# Patient Record
Sex: Male | Born: 2000 | Race: White | Hispanic: No | Marital: Single | Smoking: Never smoker
Health system: Southern US, Community
[De-identification: ages and names within clinical notes are randomized; demographics above are authoritative.]

## PROBLEM LIST (undated history)

## (undated) DIAGNOSIS — G8929 Other chronic pain: Secondary | ICD-10-CM

## (undated) HISTORY — PX: HAND SURGERY: SHX662

---

## 2020-07-02 ENCOUNTER — Other Ambulatory Visit: Payer: Self-pay

## 2020-07-02 ENCOUNTER — Emergency Department
Admission: EM | Admit: 2020-07-02 | Discharge: 2020-07-02 | Disposition: A | Discharge status: Home / Self Care | Payer: BC Managed Care – PPO | Attending: Student in an Organized Health Care Education/Training Program | Admitting: Student in an Organized Health Care Education/Training Program

## 2020-07-02 ENCOUNTER — Encounter: Payer: Self-pay | Admitting: Emergency Medicine

## 2020-07-02 ENCOUNTER — Emergency Department: Payer: BC Managed Care – PPO

## 2020-07-02 DIAGNOSIS — Y9389 Activity, other specified: Secondary | ICD-10-CM | POA: Diagnosis not present

## 2020-07-02 DIAGNOSIS — S62326A Displaced fracture of shaft of fifth metacarpal bone, right hand, initial encounter for closed fracture: Secondary | ICD-10-CM | POA: Diagnosis not present

## 2020-07-02 DIAGNOSIS — Y9289 Other specified places as the place of occurrence of the external cause: Secondary | ICD-10-CM | POA: Diagnosis not present

## 2020-07-02 DIAGNOSIS — Y999 Unspecified external cause status: Secondary | ICD-10-CM | POA: Diagnosis not present

## 2020-07-02 DIAGNOSIS — X58XXXA Exposure to other specified factors, initial encounter: Secondary | ICD-10-CM | POA: Diagnosis not present

## 2020-07-02 DIAGNOSIS — S6990XA Unspecified injury of unspecified wrist, hand and finger(s), initial encounter: Secondary | ICD-10-CM | POA: Diagnosis present

## 2020-07-02 NOTE — Discharge Instructions (Addendum)
Follow up with Dr Martha Clan, call on Monday for a follow up appointment, wear the splint until evaluated by orthopedics, cover it while in the shower, take tylenol and ibuprofen for pain as needed

## 2020-07-02 NOTE — ED Provider Notes (Signed)
[facility] Emergency Department Provider Note  ____________________________________________   First MD Initiated Contact with Patient 07/02/20 1155     (approximate)  I have reviewed the triage vital signs and the nursing notes.   HISTORY  Chief Complaint Hand Injury    HPI Johnathan Gutierrez is a 19 y.o. male presents emergency department right hand pain.  Patient states he punched a wooden tenths last night.  Continued pain and swelling to the right hand.  No other injuries reported.  Pain scale is 5/10    History reviewed. No pertinent past medical history.  There are no problems to display for this patient.   History reviewed. No pertinent surgical history.  Prior to Admission medications   Medication Sig Start Date End Date Taking? Authorizing Provider  desvenlafaxine (PRISTIQ) 50 MG 24 hr tablet Take 50 mg by mouth daily.   Yes [provider]  lamoTRIgine (LAMICTAL) 25 MG tablet Take 50 mg by mouth daily.   Yes [provider]  traZODone (DESYREL) 50 MG tablet Take 50 mg by mouth at bedtime.   Yes [provider]    Allergies Peanut-containing drug products  No family history on file.  Social History Social History   Tobacco Use  . Smoking status: Never Smoker  . Smokeless tobacco: Never Used  Substance Use Topics  . Alcohol use: Not on file  . Drug use: Not on file    Review of Systems  Constitutional: No fever/chills Eyes: No visual changes. ENT: No sore throat. Respiratory: Denies cough Genitourinary: Negative for dysuria. Musculoskeletal: Negative for back pain.  Positive for right hand pain Skin: Negative for rash. Psychiatric: no mood changes,     ____________________________________________   PHYSICAL EXAM:  VITAL SIGNS: ED Triage Vitals  Enc Vitals Group     BP 07/02/20 1147 120/75     Pulse Rate 07/02/20 1147 70     Resp 07/02/20 1147 18     Temp 07/02/20 1147 98 F (36.7 C)      Temp Source 07/02/20 1147 Oral     SpO2 07/02/20 1147 100 %     Weight 07/02/20 1143 195 lb (88.5 kg)     Height 07/02/20 1143 6\' 3"  (1.905 m)     Head Circumference --      Peak Flow --      Pain Score 07/02/20 1142 8     Pain Loc --      Pain Edu? --      Excl. in GC? --     Constitutional: Alert and oriented. Well appearing and in no acute distress. Eyes: Conjunctivae are normal.  Head: Atraumatic. Nose: No congestion/rhinnorhea. Mouth/Throat: Mucous membranes are moist.   Neck:  supple no lymphadenopathy noted Cardiovascular: Normal rate, regular rhythm.  Respiratory: Normal respiratory effort.  No retractions,  GU: deferred Musculoskeletal: FROM all extremities, warm and well perfused, swelling noted to the metacarpal area of the right hand along fourth and fifth metacarpals, neurovascular is intact, tenderness noted along fourth and fifth metacarpals Neurologic:  Normal speech and language.  Skin:  Skin is warm, dry and intact. No rash noted. Psychiatric: Mood and affect are normal. Speech and behavior are normal.  ____________________________________________   LABS (all labs ordered are listed, but only abnormal results are displayed)  Labs Reviewed - No data to display ____________________________________________   ____________________________________________  RADIOLOGY  X-ray of the right hand shows a fracture of the fifth metacarpal  ____________________________________________   PROCEDURES  Procedure(s)  performed: Ulnar gutter OCL applied by the tech   Procedures    ____________________________________________   INITIAL IMPRESSION / ASSESSMENT AND PLAN / ED COURSE  Pertinent labs & imaging results that were available during my care of the patient were reviewed by me and considered in my medical decision making (see chart for details).   Patient is a 19 year old male presents emergency department for right hand injury.  See HPI.  Physical exam is  consistent with a metacarpal fracture.  X-ray of the right hand shows fifth metacarpal fracture along with shaft  Did explain findings to the patient.  Is placed in a ulnar gutter OCL.  He was instructed to follow-up with [facility].  Elevate and ice.  Tylenol and ibuprofen for pain.  Patient states he does not need anything stronger for pain.  He was discharged in stable condition.     Johnathan Gutierrez was evaluated in Emergency Department on 07/02/2020 for the symptoms described in the history of present illness. He was evaluated in the context of the global COVID-19 pandemic, which necessitated consideration that the patient might be at risk for infection with the SARS-CoV-2 virus that causes COVID-19. Institutional protocols and algorithms that pertain to the evaluation of patients at risk for COVID-19 are in a state of rapid change based on information released by regulatory bodies including the CDC and federal and state organizations. These policies and algorithms were followed during the patient's care in the ED.    As part of my medical decision making, I reviewed the following data within the electronic MEDICAL RECORD NUMBER Nursing notes reviewed and incorporated, Old chart reviewed, Radiograph reviewed , Notes from prior ED visits and [place] Controlled Substance Database  ____________________________________________   FINAL CLINICAL IMPRESSION(S) / ED DIAGNOSES  Final diagnoses:  Displaced fracture of shaft of fifth metacarpal bone, right hand, initial encounter for closed fracture      NEW MEDICATIONS STARTED DURING THIS VISIT:  New Prescriptions   No medications on file     Note:  This document was prepared using Dragon voice recognition software and may include unintentional dictation errors.    Faythe Ghee, PA-C 07/02/20 1258    Willy Eddy, MD 07/02/20 1444

## 2020-07-02 NOTE — ED Triage Notes (Signed)
Presents with right hand pain  States he punched a wooden fencer last pm  Swelling noted to lateral aspect of hand

## 2020-11-22 ENCOUNTER — Other Ambulatory Visit: Payer: Self-pay | Admitting: Rheumatology

## 2020-11-22 DIAGNOSIS — M25559 Pain in unspecified hip: Secondary | ICD-10-CM

## 2020-11-22 DIAGNOSIS — M461 Sacroiliitis, not elsewhere classified: Secondary | ICD-10-CM

## 2020-11-24 ENCOUNTER — Emergency Department
Admission: EM | Admit: 2020-11-24 | Discharge: 2020-11-24 | Disposition: A | Discharge status: Home / Self Care | Payer: BC Managed Care – PPO | Attending: Emergency Medicine | Admitting: Emergency Medicine

## 2020-11-24 ENCOUNTER — Other Ambulatory Visit: Payer: Self-pay

## 2020-11-24 ENCOUNTER — Encounter: Payer: Self-pay | Admitting: Emergency Medicine

## 2020-11-24 DIAGNOSIS — R1030 Lower abdominal pain, unspecified: Secondary | ICD-10-CM | POA: Insufficient documentation

## 2020-11-24 DIAGNOSIS — R42 Dizziness and giddiness: Secondary | ICD-10-CM | POA: Diagnosis not present

## 2020-11-24 DIAGNOSIS — R110 Nausea: Secondary | ICD-10-CM | POA: Diagnosis not present

## 2020-11-24 DIAGNOSIS — Z5321 Procedure and treatment not carried out due to patient leaving prior to being seen by health care provider: Secondary | ICD-10-CM | POA: Insufficient documentation

## 2020-11-24 HISTORY — DX: Other chronic pain: G89.29

## 2020-11-24 LAB — URINALYSIS, COMPLETE (UACMP) WITH MICROSCOPIC
Bilirubin Urine: NEGATIVE
Glucose, UA: NEGATIVE mg/dL
Hgb urine dipstick: NEGATIVE
Ketones, ur: NEGATIVE mg/dL
Leukocytes,Ua: NEGATIVE
Nitrite: NEGATIVE
Protein, ur: NEGATIVE mg/dL
Specific Gravity, Urine: 1.019 (ref 1.005–1.030)
Squamous Epithelial / HPF: NONE SEEN (ref 0–5)
pH: 6 (ref 5.0–8.0)

## 2020-11-24 LAB — CBC
HCT: 48.7 % (ref 39.0–52.0)
Hemoglobin: 16.4 g/dL (ref 13.0–17.0)
MCH: 28.3 pg (ref 26.0–34.0)
MCHC: 33.7 g/dL (ref 30.0–36.0)
MCV: 84.1 fL (ref 80.0–100.0)
Platelets: 230 10*3/uL (ref 150–400)
RBC: 5.79 MIL/uL (ref 4.22–5.81)
RDW: 12 % (ref 11.5–15.5)
WBC: 5 10*3/uL (ref 4.0–10.5)
nRBC: 0 % (ref 0.0–0.2)

## 2020-11-24 LAB — COMPREHENSIVE METABOLIC PANEL
ALT: 25 U/L (ref 0–44)
AST: 21 U/L (ref 15–41)
Albumin: 4.9 g/dL (ref 3.5–5.0)
Alkaline Phosphatase: 86 U/L (ref 38–126)
Anion gap: 10 (ref 5–15)
BUN: 17 mg/dL (ref 6–20)
CO2: 30 mmol/L (ref 22–32)
Calcium: 10.2 mg/dL (ref 8.9–10.3)
Chloride: 103 mmol/L (ref 98–111)
Creatinine, Ser: 0.89 mg/dL (ref 0.61–1.24)
GFR, Estimated: >60 mL/min (ref >60)
Glucose, Bld: 110 mg/dL — ABNORMAL HIGH (ref 70–99)
Potassium: 5 mmol/L (ref 3.5–5.1)
Sodium: 143 mmol/L (ref 135–145)
Total Bilirubin: 1 mg/dL (ref 0.3–1.2)
Total Protein: 8 g/dL (ref 6.5–8.1)

## 2020-11-24 LAB — LIPASE, BLOOD: Lipase: 31 U/L (ref 11–51)

## 2020-11-24 NOTE — ED Triage Notes (Signed)
Pt comes into the ED via POV c/o lower abdominal pain that wraps around to the right lower back.  Pt denies any h/o kidney stones.  Pt denies any vomiting but has had some nausea.  Pt also admits to some dizziness and feels like his breath is being taken away because the pain is so bad.  Pt states he ws recently diagnosed with a pain disorder, but denies being on any narcotics.  Pt states last BM was yesterday evening.  Pt currently in NAD with even and unlabored respirations.  Denies any urination problems.

## 2020-12-02 ENCOUNTER — Other Ambulatory Visit: Payer: Self-pay

## 2020-12-02 ENCOUNTER — Ambulatory Visit
Admission: RE | Admit: 2020-12-02 | Discharge: 2020-12-02 | Disposition: A | Discharge status: Home / Self Care | Payer: BC Managed Care – PPO | Source: Ambulatory Visit | Attending: Rheumatology | Admitting: Rheumatology

## 2020-12-02 DIAGNOSIS — M461 Sacroiliitis, not elsewhere classified: Secondary | ICD-10-CM

## 2020-12-02 DIAGNOSIS — M25559 Pain in unspecified hip: Secondary | ICD-10-CM

## 2021-02-25 IMAGING — DX DG HAND COMPLETE 3+V*R*
3 series · 3 of 3 positions shown · non-contrast
Comparison: None.

CLINICAL DATA: Right hand pain to ulnar aspect of hand as well as
swelling. Patient states pain all over but mainly in fifth
metacarpal. Patient states that he punched [place] fence last
night.

EXAM:
RIGHT HAND - COMPLETE 3+ VIEW

[hand ap]
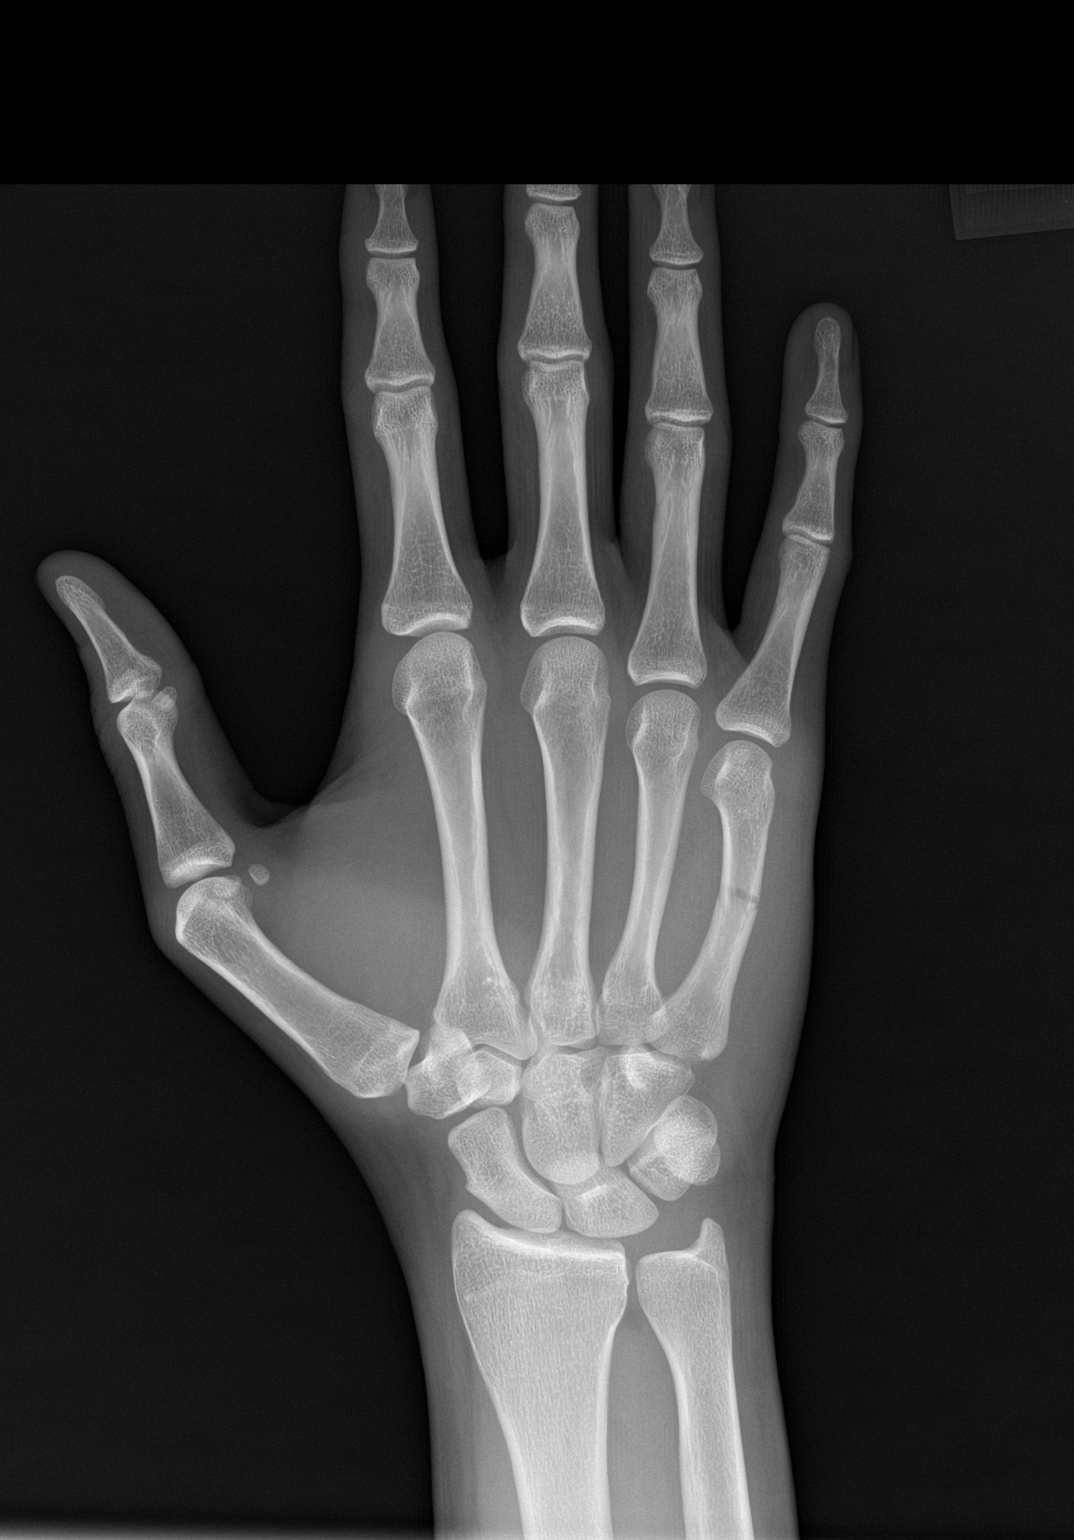

[hand obl]
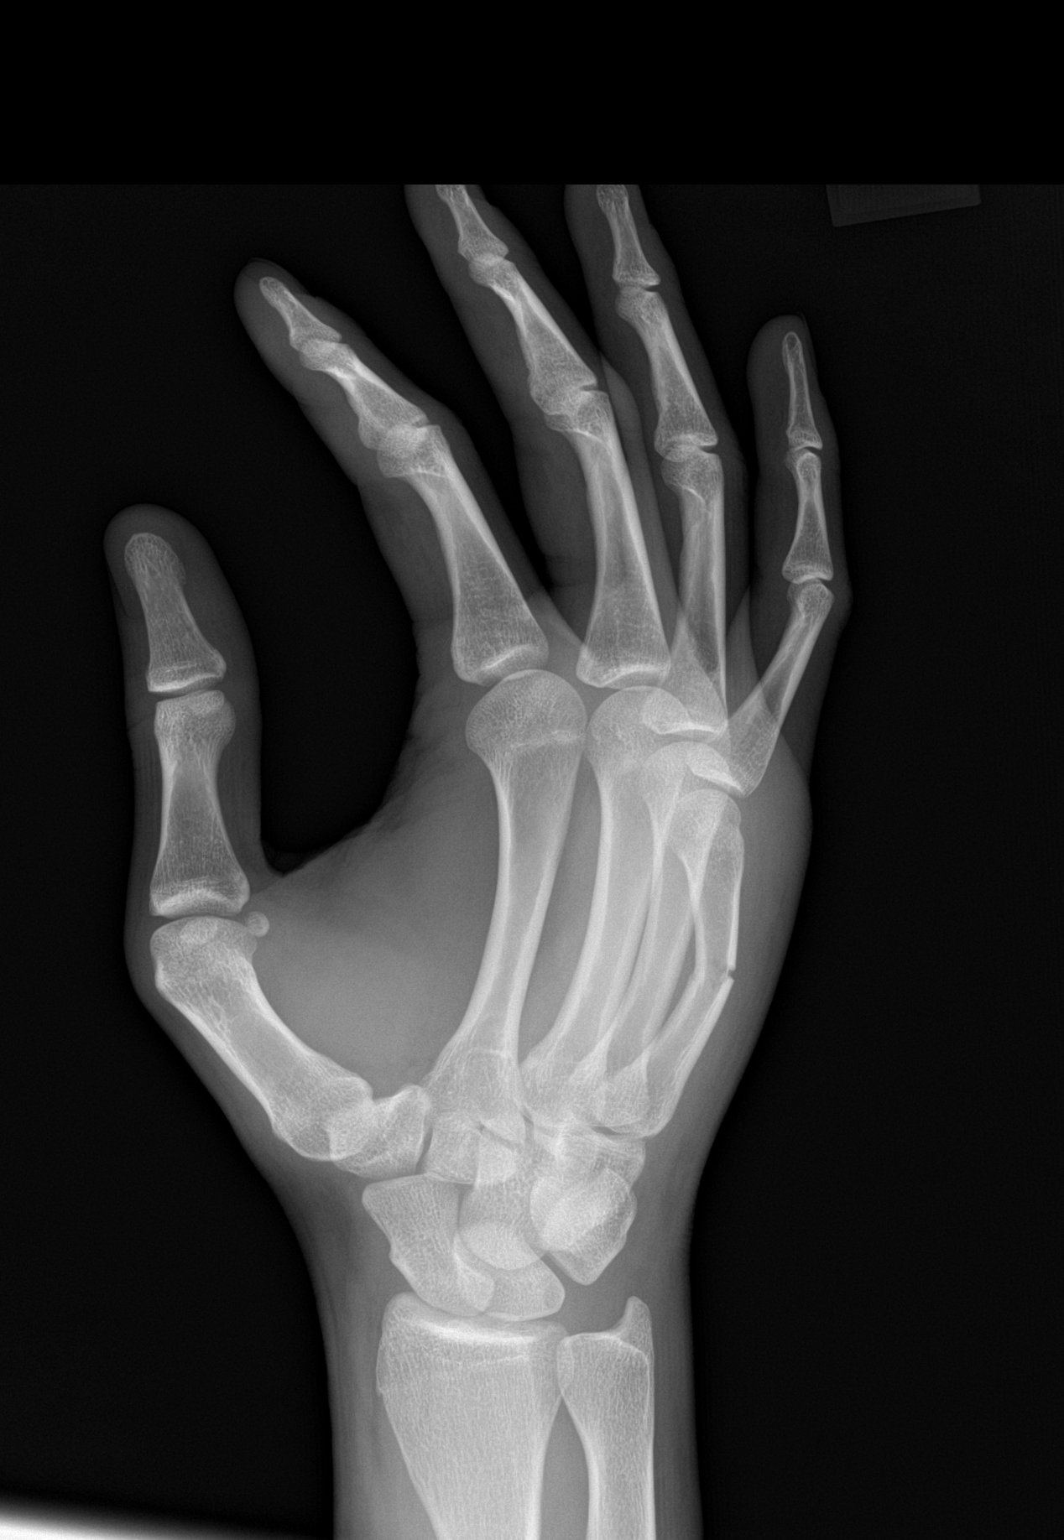

[hand lat]
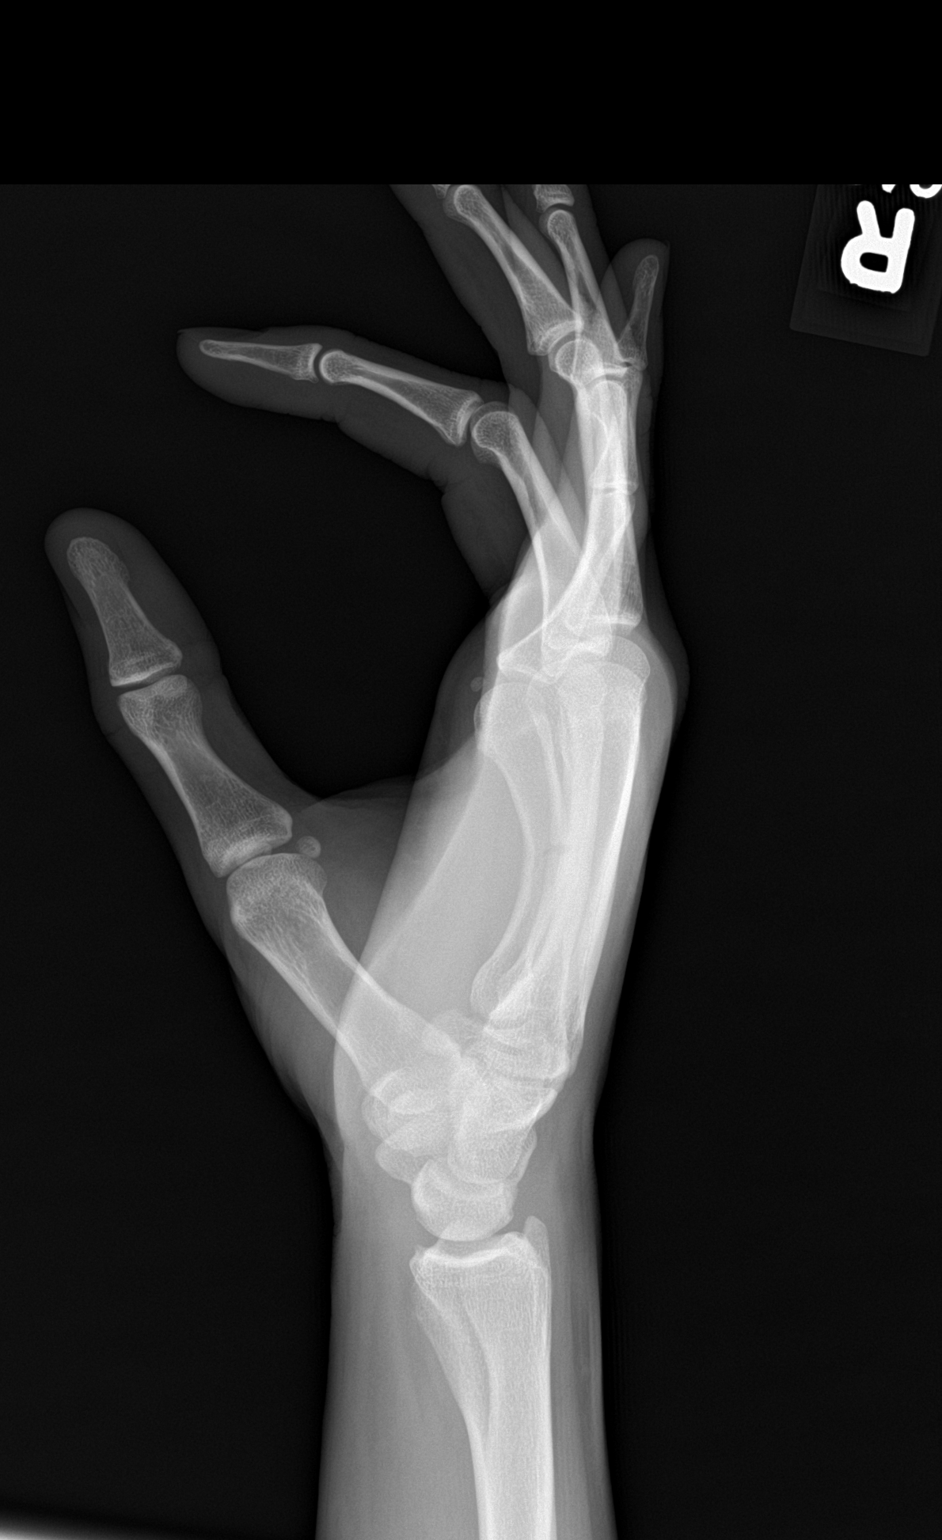

[3 of 3 positions shown; findings below may reference images not displayed]

FINDINGS: There is a mildly displaced and angulated transverse fracture of the
fifth metacarpal mid shaft. The remaining bones of the right hand
appear normal. Regional soft tissues are unremarkable.
IMPRESSION: Mildly displaced and angulated fracture of the fifth metacarpal mid
shaft.

## 2022-06-11 ENCOUNTER — Encounter: Payer: Self-pay | Admitting: Family Medicine

## 2022-06-11 ENCOUNTER — Other Ambulatory Visit: Payer: Self-pay | Admitting: Family Medicine

## 2022-06-11 DIAGNOSIS — G894 Chronic pain syndrome: Secondary | ICD-10-CM

## 2022-06-11 DIAGNOSIS — R5382 Chronic fatigue, unspecified: Secondary | ICD-10-CM | POA: Insufficient documentation

## 2022-06-11 HISTORY — DX: Chronic pain syndrome: G89.4

## 2022-07-05 ENCOUNTER — Ambulatory Visit (INDEPENDENT_AMBULATORY_CARE_PROVIDER_SITE_OTHER): Payer: BC Managed Care – PPO | Admitting: Oncology

## 2022-07-05 ENCOUNTER — Encounter: Payer: Self-pay | Admitting: Oncology

## 2022-07-05 VITALS — BP 122/60 | HR 103 | Temp 97.9°F | Resp 18 | Ht 76.25 in | Wt 221.0 lb

## 2022-07-05 DIAGNOSIS — R1030 Lower abdominal pain, unspecified: Secondary | ICD-10-CM | POA: Diagnosis not present

## 2022-07-05 DIAGNOSIS — R197 Diarrhea, unspecified: Secondary | ICD-10-CM | POA: Diagnosis not present

## 2022-07-05 DIAGNOSIS — R110 Nausea: Secondary | ICD-10-CM

## 2022-07-05 NOTE — Progress Notes (Signed)
[facility] [address] [place], [place] [address] Phone: [phone] Fax: [phone]   Office Visit Note  Patient Name: Johnathan Gutierrez  Date of [id number]  Med Rec number [id number]  Date of Service: 07/05/2022  Peanut-containing drug products  Chief Complaint  Patient presents with   Cough   C/o chills, body aches, fatigue, nausea, feeling hot and cold and skin sensitivity X 2 days. No sick contacts. No sore throat. Some lower abdominal pain. Had diarrhea yesterday X 4. Had a quesadilla for lunch yesterday. Took Tylenol yesterday twice at 10am and 4pm. Ate a piece of toast with butter this morning and tolerated well.  Nausea has improved today.  Current Medication:  Outpatient Encounter Medications as of 07/05/2022  Medication Sig   DULoxetine (CYMBALTA) 30 MG capsule Take 90 mg by mouth daily.   lamoTRIgine (LAMICTAL) 25 MG tablet Take 50 mg by mouth daily.   traZODone (DESYREL) 50 MG tablet Take 50 mg by mouth at bedtime.   desvenlafaxine (PRISTIQ) 50 MG 24 hr tablet Take 50 mg by mouth daily. (Patient not taking: Reported on 07/05/2022)   No facility-administered encounter medications on file as of 07/05/2022.    Medical History: Past Medical History:  Diagnosis Date   Chronic pain    Pain syndrome, chronic 06/11/2022     Vital Signs: Pulse (!) 103   Temp 97.9 F (36.6 C) (Tympanic)   Resp 18   Ht 6' 4.25" (1.937 m)   Wt 221 lb (100.2 kg)   SpO2 99%   BMI 26.72 kg/m    Review of Systems  Constitutional:  Positive for chills, diaphoresis and fatigue.  Gastrointestinal:  Positive for abdominal pain, diarrhea and nausea.  Musculoskeletal:  Positive for myalgias.  Neurological:  Positive for weakness.    Physical Exam Constitutional:      Appearance: Normal appearance.  HENT:     Right Ear: Tympanic membrane normal.     Left Ear: Tympanic membrane normal.     Nose: Nose normal.     Mouth/Throat:     Mouth: Mucous membranes are moist.      Pharynx: Posterior oropharyngeal erythema present.  Cardiovascular:     Rate and Rhythm: Normal rate and regular rhythm.  Abdominal:     General: Bowel sounds are increased.     Palpations: Abdomen is soft.     Tenderness: There is no abdominal tenderness.     Comments: No reproducible abdominal pain  Neurological:     Mental Status: He is alert.    No results found for this or any previous visit (from the past 24 hour(s)).  Assessment/Plan: 1. Diarrhea, unspecified type -Symptoms consistent with viral gastroenteritis.  Afebrile.  Exam reassuring. -COVID and flu negative. -Diarrhea appears to have improved some today.  Had 4 episodes yesterday. -Continue to hydrate and eat bland diet.  Education provided in clinic and also via MyChart. -Discussed viral illness typically last about 5 days.  Symptoms should start to improve over the next day or so.  Please let me know if they do not.  2. Nausea -Nausea has improved today as well.  No vomiting. -Bland diet per above. -Please let me know if this worsens and we can send in and antiemetic.  3. Lower abdominal pain -Exam benign.  Nonreproducible lower abdominal pain. -Continue to monitor for now. -Symptoms appear to be improving from yesterday. -May take Tylenol for abdominal pain and/or fever.  Do not recommend ibuprofen due to possibly worsening belly issues.  Disposition-return to clinic if symptoms worsen or fail to improve.   General Counseling: tymere depuy understanding of the findings of todays visit and agrees with plan of treatment. I have discussed any further diagnostic evaluation that may be needed or ordered today. We also reviewed his medications today. he has been encouraged to call the office with any questions or concerns that should arise related to todays visit.   No orders of the defined types were placed in this encounter.   No orders of the defined types were placed in this encounter.   I spent 20  minutes dedicated to the care of this patient (face-to-face and non-face-to-face) on the date of the encounter to include what is described in the assessment and plan.   Durenda Hurt, NP 07/05/2022 1:05 PM

## 2023-02-06 ENCOUNTER — Ambulatory Visit (INDEPENDENT_AMBULATORY_CARE_PROVIDER_SITE_OTHER): Payer: No Typology Code available for payment source | Admitting: Oncology

## 2023-02-06 ENCOUNTER — Encounter: Payer: Self-pay | Admitting: Oncology

## 2023-02-06 VITALS — HR 110 | Temp 97.2°F | Ht 75.0 in | Wt 225.0 lb

## 2023-02-06 DIAGNOSIS — H9202 Otalgia, left ear: Secondary | ICD-10-CM

## 2023-02-06 MED ORDER — NEOMYCIN-POLYMYXIN-HC 3.5-10000-1 OT SOLN: 4.0000 [drp] | Freq: Four times a day (QID) | OTIC | 0 refills | Status: AC

## 2023-02-06 NOTE — Progress Notes (Signed)
[place] [place], [place] [address] Phone: [phone] Fax: [phone]   Office Visit Note  Patient Name: Johnathan Gutierrez  Date of [id number]  Med Rec number [id number]  Date of Service: 02/06/2023  Peanut-containing drug products  No chief complaint on file.  Patient is an 22 y.o. student here for complaints of left ear and jaw pain that developed last Wednesday. Got home Thursday night from [place] and sx felt  worse. Friday Johnathan Gutierrez woke up with decreased hearing.  Went to urgent care on Friday and was given polymixin ear drops and Augmentin. Was told ear was very swollen and was hard to tell if it was an inner ear infection verse outer ear (swimmers ear). Started antibiotics on Saturday. Feels like it is improving some but not quickly. Noticed some drainage to pillowcase this morning when Johnathan Gutierrez woke up.   Has hx of firbromyalgia.   Current Medication:  Outpatient Encounter Medications as of 02/06/2023  Medication Sig   desvenlafaxine (PRISTIQ) 50 MG 24 hr tablet Take 50 mg by mouth daily. (Patient not taking: Reported on 07/05/2022)   DULoxetine (CYMBALTA) 30 MG capsule Take 90 mg by mouth daily.   lamoTRIgine (LAMICTAL) 25 MG tablet Take 50 mg by mouth daily.   traZODone (DESYREL) 50 MG tablet Take 50 mg by mouth at bedtime.   No facility-administered encounter medications on file as of 02/06/2023.   Medical History: Past Medical History:  Diagnosis Date   Chronic pain    Pain syndrome, chronic 06/11/2022   Vital Signs: There were no vitals taken for this visit.  ROS: As per HPI.  All other pertinent ROS negative.     Review of Systems  HENT:  Positive for dental problem (Right jaw pain), ear discharge, ear pain and hearing loss.     Physical Exam Constitutional:      Appearance: Normal appearance.  HENT:     Right Ear: Tympanic membrane normal. There is impacted cerumen.     Left Ear: Decreased hearing noted. Drainage, swelling and tenderness  present.  Neurological:     Mental Status: Johnathan Gutierrez is alert.     No results found for this or any previous visit (from the past 24 hour(s)).  Assessment/Plan: 1. Ear pain, left -Unable to clearly visualize tympanic membrane.  Lots of cerumen and swelling present.  Do not want to irrigate given ear pain.  Have some concern for possible inner ear rupture given drainage Johnathan Gutierrez noticed this morning.  This also can be present with swimmer's ear.  Discussed switching to Cortisporin that has a steroid to help with swelling but also contains the antibiotic.  Johnathan Gutierrez will do 4 drops 4 times a day in left ear.  Continue Augmentin per urgent care provider.  Please let me know if your symptoms worsen or fail to improve.  Discussed ENT referral should this happen.  Would recommend follow-up next week.  - amoxicillin-clavulanate (AUGMENTIN) 875-125 MG tablet; Take 1 tablet by mouth 2 (two) times daily. - neomycin-polymyxin-hydrocortisone (CORTISPORIN) OTIC solution; Place 4 drops into both ears 4 (four) times daily.  Dispense: 10 mL; Refill: 0  Disposition-return to clinic next week for follow-up  General Counseling: ryatt carodine understanding of the findings of todays visit and agrees with plan of treatment. I have discussed any further diagnostic evaluation that may be needed or ordered today. We also reviewed his medications today. Johnathan Gutierrez has been encouraged to call the office with any questions or concerns that should arise related  to todays visit.   No orders of the defined types were placed in this encounter.   No orders of the defined types were placed in this encounter.   I spent 20 minutes dedicated to the care of this patient (face-to-face and non-face-to-face) on the date of the encounter to include what is described in the assessment and plan.   Faythe Casa, NP 02/06/2023 8:42 AM

## 2023-02-11 ENCOUNTER — Ambulatory Visit (INDEPENDENT_AMBULATORY_CARE_PROVIDER_SITE_OTHER): Payer: No Typology Code available for payment source | Admitting: Oncology

## 2023-02-11 ENCOUNTER — Encounter: Payer: Self-pay | Admitting: Oncology

## 2023-02-11 VITALS — BP 130/70 | HR 90 | Temp 98.7°F

## 2023-02-11 DIAGNOSIS — H9202 Otalgia, left ear: Secondary | ICD-10-CM

## 2023-02-11 NOTE — Progress Notes (Signed)
[facility] [address] [place], [place] [address] Phone: [phone] Fax: [phone]   Office Visit Note  Patient Name: Johnathan Gutierrez  Date of [id number]  Med Rec number [id number]  Date of Service: 02/11/2023  Peanut-containing drug products  Chief Complaint  Patient presents with   Ear Pain   Patient is an 22 y.o. student here for follow-up for left ear pain.  Evaluated last week for same concern and instructed to continue Augmentin and prescribed Cortisporin otic solution for left ear swelling.  States symptoms have improved some.  He is able to hear slightly better although not fully.  Denies any recurrent drainage from left ear.  He completed 10-day course of Augmentin 1 to 2 days ago.  Current Medication:  Outpatient Encounter Medications as of 02/11/2023  Medication Sig   desvenlafaxine (PRISTIQ) 50 MG 24 hr tablet Take 50 mg by mouth daily. (Patient not taking: Reported on 07/05/2022)   DULoxetine (CYMBALTA) 30 MG capsule Take 90 mg by mouth daily.   lamoTRIgine (LAMICTAL) 25 MG tablet Take 50 mg by mouth daily.   neomycin-polymyxin-hydrocortisone (CORTISPORIN) OTIC solution Place 4 drops into both ears 4 (four) times daily.   traZODone (DESYREL) 50 MG tablet Take 50 mg by mouth at bedtime.   No facility-administered encounter medications on file as of 02/11/2023.      Medical History: Past Medical History:  Diagnosis Date   Chronic pain    Pain syndrome, chronic 06/11/2022     Vital Signs: BP 130/70 (BP Location: Left Arm, Patient Position: Sitting, Cuff Size: Normal)   Pulse 90   Temp 98.7 F (37.1 C) (Tympanic)   SpO2 97%   ROS: As per HPI.  All other pertinent ROS negative.     Review of Systems  HENT:  Positive for ear discharge and ear pain.     Physical Exam Constitutional:      Appearance: Normal appearance.  HENT:     Left Ear: Decreased hearing noted. There is impacted cerumen. Tympanic membrane is erythematous. Tympanic membrane  is not retracted.     Ears:     Comments: Left ear TM slightly erythematous but improvement noted from last week.  Still quite a bit of cerumen present. Neurological:     Mental Status: He is alert.     No results found for this or any previous visit (from the past 24 hour(s)).  Assessment/Plan: 1. Ear pain, left -Exam shows improvement of left TM edema although it is still erythematous. -Recommend continuing Cortisporin drops for the next 2 to 3 days. -Completed Augmentin 1 to 2 days ago. -Keep ear dry.  Would recommend trying Debrox to help with cerumen impaction now that TM is improved. -Continue to monitor and let me know if anything changes over the next few days.  Disposition-return to clinic as needed.  General Counseling: sueo israel understanding of the findings of todays visit and agrees with plan of treatment. I have discussed any further diagnostic evaluation that may be needed or ordered today. We also reviewed his medications today. he has been encouraged to call the office with any questions or concerns that should arise related to todays visit.   No orders of the defined types were placed in this encounter.   No orders of the defined types were placed in this encounter.   I spent 20 minutes dedicated to the care of this patient (face-to-face and non-face-to-face) on the date of the encounter to include what is described in the  assessment and plan.   Durenda Hurt, NP 02/11/2023 11:04 AM

## 2024-01-07 ENCOUNTER — Ambulatory Visit
Admission: RE | Admit: 2024-01-07 | Discharge: 2024-01-07 | Disposition: A | Discharge status: Home / Self Care | Payer: Self-pay | Source: Ambulatory Visit | Attending: Emergency Medicine | Admitting: Emergency Medicine

## 2024-01-07 VITALS — BP 128/81 | HR 81 | Temp 98.1°F | Resp 19 | Ht 75.0 in | Wt 195.0 lb

## 2024-01-07 DIAGNOSIS — L509 Urticaria, unspecified: Secondary | ICD-10-CM | POA: Diagnosis not present

## 2024-01-07 MED ORDER — DIPHENHYDRAMINE HCL 50 MG PO CAPS
50.0000 mg | ORAL_CAPSULE | Freq: Once | ORAL | Status: AC
  Administered 2024-01-07: 50 mg via ORAL

## 2024-01-07 MED ORDER — CETIRIZINE HCL 10 MG PO TABS: 10.0000 mg | ORAL_TABLET | Freq: Every day | ORAL | 0 refills | Status: AC

## 2024-01-07 NOTE — Discharge Instructions (Addendum)
 Continue the prednisone as directed.  Start the Zyrtec as directed.    Call 911 and go to the emergency department if you have difficulty swallowing, difficulty breathing, or worsening symptoms.    Follow-up with your primary care provider tomorrow.

## 2024-01-07 NOTE — ED Triage Notes (Signed)
 Patient presents to UC for allergic reaction x 2 days. Started prednisone yesterday with no improvement.   Denies changes to diet or products.

## 2024-01-07 NOTE — ED Triage Notes (Signed)
 X2days Pt states that he has hives all over. Pt states that he had a evisit with his school and they prescribed Prednisone.

## 2024-01-07 NOTE — ED Provider Notes (Signed)
Johnathan Gutierrez    CSN: [id number] Arrival date & time: 01/07/24  1718      History   Chief Complaint Chief Complaint  Patient presents with   Allergic Reaction    itchy hives and rash all over body - Entered by patient    HPI Johnathan Gutierrez is a 23 y.o. male.  Patient presents with hives since yesterday morning.  He is currently on prednisone that was prescribed on an e-visit yesterday; 40 mg taken yesterday and 40 mg taken today.  He last took Benadryl last night; no antihistamine taken today.  His rash has improved today.  No difficulty swallowing or breathing.  He does not know of any specific trigger for his hives.  He did start using testosterone gel a month ago but has not had any prior reaction.  He also may have recently changed laundry detergent but it is the "free and clear" version.  The history is provided by the patient and medical records.    Past Medical History:  Diagnosis Date   Chronic pain    Pain syndrome, chronic 06/11/2022    Patient Active Problem List   Diagnosis Date Noted   Pain syndrome, chronic 06/11/2022   Chronic fatigue 06/11/2022    Past Surgical History:  Procedure Laterality Date   HAND SURGERY         Home Medications    Prior to Admission medications   Medication Sig Start Date End Date Taking? Authorizing Provider  cetirizine (ZYRTEC ALLERGY) 10 MG tablet Take 1 tablet (10 mg total) by mouth daily for 14 days. 01/07/24 01/21/24 Yes Mickie Bail, NP  desvenlafaxine (PRISTIQ) 50 MG 24 hr tablet Take 50 mg by mouth daily. Patient not taking: Reported on 07/05/2022    [provider]  DULoxetine (CYMBALTA) 30 MG capsule Take 90 mg by mouth daily.   Yes [provider]  lamoTRIgine (LAMICTAL) 25 MG tablet Take 50 mg by mouth daily.    [provider]  neomycin-polymyxin-hydrocortisone (CORTISPORIN) OTIC solution Place 4 drops into both ears 4 (four) times daily. 02/06/23   Mauro Kaufmann, NP  traZODone  (DESYREL) 50 MG tablet Take 25 mg by mouth at bedtime.    [provider]    Family History History reviewed. No pertinent family history.  Social History Social History   Tobacco Use   Smoking status: Never   Smokeless tobacco: Never  Vaping Use   Vaping status: Never Used  Substance Use Topics   Alcohol use: Yes    Comment: occ   Drug use: Never     Allergies   Peanut-containing drug products   Review of Systems Review of Systems  Constitutional:  Negative for chills and fever.  HENT:  Negative for sore throat, trouble swallowing and voice change.   Respiratory:  Negative for cough and shortness of breath.   Cardiovascular:  Negative for chest pain and palpitations.  Skin:  Positive for color change and rash.     Physical Exam Triage Vital Signs ED Triage Vitals  Encounter Vitals Group     BP      Systolic BP Percentile      Diastolic BP Percentile      Pulse      Resp      Temp      Temp src      SpO2      Weight      Height      Head Circumference  Peak Flow      Pain Score      Pain Loc      Pain Education      Exclude from Growth Chart    No data found.  Updated Vital Signs BP 128/81 (BP Location: Left Arm)   Pulse 81   Temp 98.1 F (36.7 C) (Oral)   Resp 19   Ht 6\' 3"  (1.905 m)   Wt 195 lb (88.5 kg)   SpO2 99%   BMI 24.37 kg/m   Visual Acuity Right Eye Distance:   Left Eye Distance:   Bilateral Distance:    Right Eye Near:   Left Eye Near:    Bilateral Near:     Physical Exam Constitutional:      General: He is not in acute distress. HENT:     Mouth/Throat:     Mouth: Mucous membranes are moist.     Pharynx: Oropharynx is clear.     Comments: Voice clear.  No oropharyngeal swelling.  No difficulty swallowing. Cardiovascular:     Rate and Rhythm: Normal rate and regular rhythm.     Heart sounds: Normal heart sounds.  Pulmonary:     Effort: Pulmonary effort is normal. No respiratory distress.     Breath  sounds: Normal breath sounds.  Skin:    General: Skin is warm and dry.     Findings: Rash present.     Comments: Faint scattered erythematous hives on trunk and extremities.  Generalized erythema of face and neck.  Neurological:     Mental Status: He is alert.      UC Treatments / Results  Labs (all labs ordered are listed, but only abnormal results are displayed) Labs Reviewed - No data to display  EKG   Radiology No results found.  Procedures Procedures (including critical care time)  Medications Ordered in UC Medications  diphenhydrAMINE (BENADRYL) capsule 50 mg (50 mg Oral Given 01/07/24 1804)    Initial Impression / Assessment and Plan / UC Course  I have reviewed the triage vital signs and the nursing notes.  Pertinent labs & imaging results that were available during my care of the patient were reviewed by me and considered in my medical decision making (see chart for details).    Hives.  Afebrile and vital signs are stable.  Lungs are clear and O2 sat is 99% on room air.  No difficulty swallowing or breathing.  Instructed patient to continue the prednisone as directed.  Instructed him to take Zyrtec daily.  Per patient request, Benadryl given here and he states he will start the Zyrtec tomorrow.  911 and ED precautions given.  Instructed him to follow-up with his PCP tomorrow.  Education provided on hives.  He agrees to plan of care.  Final Clinical Impressions(s) / UC Diagnoses   Final diagnoses:  Hives     Discharge Instructions      Continue the prednisone as directed.  Start the Zyrtec as directed.    Call 911 and go to the emergency department if you have difficulty swallowing, difficulty breathing, or worsening symptoms.    Follow-up with your primary care provider tomorrow.     ED Prescriptions     Medication Sig Dispense Auth. Provider   cetirizine (ZYRTEC ALLERGY) 10 MG tablet Take 1 tablet (10 mg total) by mouth daily for 14 days. 14 tablet  Mickie Bail, NP      PDMP not reviewed this encounter.   Mickie Bail, NP 01/07/24  1805
